# Patient Record
Sex: Female | Born: 1995 | Race: White | Hispanic: No | Marital: Single | Smoking: Never smoker
Health system: Southern US, Community
[De-identification: ages and names within clinical notes are randomized; demographics above are authoritative.]

## PROBLEM LIST (undated history)

## (undated) DIAGNOSIS — J45909 Unspecified asthma, uncomplicated: Secondary | ICD-10-CM

## (undated) DIAGNOSIS — F419 Anxiety disorder, unspecified: Secondary | ICD-10-CM

## (undated) DIAGNOSIS — J189 Pneumonia, unspecified organism: Secondary | ICD-10-CM

## (undated) DIAGNOSIS — F32A Depression, unspecified: Secondary | ICD-10-CM

## (undated) DIAGNOSIS — F909 Attention-deficit hyperactivity disorder, unspecified type: Secondary | ICD-10-CM

## (undated) DIAGNOSIS — F3281 Premenstrual dysphoric disorder: Secondary | ICD-10-CM

## (undated) DIAGNOSIS — F429 Obsessive-compulsive disorder, unspecified: Secondary | ICD-10-CM

## (undated) DIAGNOSIS — R519 Headache, unspecified: Secondary | ICD-10-CM

## (undated) HISTORY — PX: TONSILLECTOMY: SUR1361

---
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
## 2022-08-05 ENCOUNTER — Ambulatory Visit (HOSPITAL_COMMUNITY)
Admission: EM | Admit: 2022-08-05 | Discharge: 2022-08-05 | Disposition: A | Discharge status: Home / Self Care | Payer: BC Managed Care – PPO | Attending: Emergency Medicine | Admitting: Emergency Medicine

## 2022-08-05 ENCOUNTER — Encounter (HOSPITAL_COMMUNITY): Payer: Self-pay | Admitting: *Deleted

## 2022-08-05 DIAGNOSIS — L0291 Cutaneous abscess, unspecified: Secondary | ICD-10-CM | POA: Insufficient documentation

## 2022-08-05 DIAGNOSIS — M545 Low back pain, unspecified: Secondary | ICD-10-CM | POA: Diagnosis present

## 2022-08-05 HISTORY — DX: Premenstrual dysphoric disorder: F32.81

## 2022-08-05 HISTORY — DX: Depression, unspecified: F32.A

## 2022-08-05 HISTORY — DX: Obsessive-compulsive disorder, unspecified: F42.9

## 2022-08-05 HISTORY — DX: Unspecified asthma, uncomplicated: J45.909

## 2022-08-05 HISTORY — DX: Anxiety disorder, unspecified: F41.9

## 2022-08-05 LAB — POCT URINALYSIS DIPSTICK, ED / UC
Bilirubin Urine: NEGATIVE
Glucose, UA: NEGATIVE mg/dL
Hgb urine dipstick: NEGATIVE
Ketones, ur: NEGATIVE mg/dL
Leukocytes,Ua: NEGATIVE
Nitrite: NEGATIVE
Protein, ur: NEGATIVE mg/dL
Specific Gravity, Urine: 1.015 (ref 1.005–1.030)
Urobilinogen, UA: 0.2 mg/dL (ref 0.0–1.0)
pH: 6.5 (ref 5.0–8.0)

## 2022-08-05 LAB — POC URINE PREG, ED: Preg Test, Ur: NEGATIVE

## 2022-08-05 MED ORDER — FLUCONAZOLE 150 MG PO TABS: 150.0000 mg | ORAL_TABLET | Freq: Once | ORAL | 0 refills | Status: DC | PRN

## 2022-08-05 MED ORDER — DOXYCYCLINE HYCLATE 100 MG PO CAPS: 100.0000 mg | ORAL_CAPSULE | Freq: Two times a day (BID) | ORAL | 0 refills | Status: AC

## 2022-08-05 NOTE — ED Provider Notes (Signed)
[facility]    CSN: [id number] Arrival date & time: 08/05/22  1457      History   Chief Complaint Chief Complaint  Patient presents with   Back Pain    HPI Wanda Brewer is a 26 y.o. female.  Presents with right lower back pain.  Additionally, discharge from bellybutton. [facility] of [facility]. Was given 5 days of Macrobid which she completed. Reports dysuria and "kidney pain" continued. Reports back pain worse with movement. Much improved with the ibuprofen dose she took last night. She was worried about kidney infection.  Yesterday noticed discharge from the bellybutton with discomfort. No abdominal pain, vomiting/diarrhea. No fevers.  Reports increase in white clumpy discharge. Denies exposures to STDs.  LMP 7/28. Patient reports some right lower quadrant abdominal discomfort, sometimes with intercourse. She was worried about appendicitis.  Denies any pelvic pain.  On chart review, [facility] was negative and culture was negative.   Past Medical History:  Diagnosis Date   Anxiety    Asthma    Depression    OCD (obsessive compulsive disorder)    PMDD (premenstrual dysphoric disorder)     There are no problems to display for this patient.   Past Surgical History:  Procedure Laterality Date   TONSILLECTOMY      OB History   No obstetric history on file.      Home Medications    Prior to Admission medications   Medication Sig Start Date End Date Taking? Authorizing Provider  albuterol (VENTOLIN HFA) 108 (90 Base) MCG/ACT inhaler Inhale into the lungs. 12/26/20  Yes [provider]  desvenlafaxine (PRISTIQ) 50 MG 24 hr tablet Take 50 mg by mouth daily. 07/26/22  Yes [provider]  doxycycline (VIBRAMYCIN) 100 MG capsule Take 1 capsule (100 mg total) by mouth 2 (two) times daily for 5 days. 08/05/22 08/10/22 Yes Berna Gitto, Lurena Joiner, PA-C  fluconazole (DIFLUCAN) 150 MG tablet Take 1 tablet  (150 mg total) by mouth once as needed for up to 2 doses (take one pill on day 1, and the second pill 3 days later if symptoms do not improve). 08/05/22  Yes Nataly Pacifico, PA-C  Lactic Ac-Citric Ac-Pot Bitart (PHEXXI) 1.8-1-0.4 % GEL Place vaginally.   Yes [provider]    Family History No family history on file.  Social History Social History   Tobacco Use   Smoking status: Never   Smokeless tobacco: Never  Vaping Use   Vaping Use: Some days  Substance Use Topics   Alcohol use: Yes   Drug use: Yes    Types: Marijuana     Allergies   Sulfa antibiotics   Review of Systems Review of Systems  Musculoskeletal:  Positive for back pain.   Per HPI  Physical Exam Triage Vital Signs ED Triage Vitals  Enc Vitals Group     BP 08/05/22 1542 138/81     Pulse Rate 08/05/22 1542 74     Resp 08/05/22 1542 18     Temp 08/05/22 1542 99.3 F (37.4 C)     Temp Source 08/05/22 1542 Oral     SpO2 08/05/22 1542 96 %     Weight --      Height --      Head Circumference --      Peak Flow --      Pain Score 08/05/22 1535 3     Pain Loc --      Pain  Edu? --      Excl. in GC? --    No data found.  Updated Vital Signs BP 138/81 (BP Location: Left Arm)   Pulse 74   Temp 99.3 F (37.4 C) (Oral)   Resp 18   LMP 07/26/2022 (Approximate)   SpO2 96%   Physical Exam Vitals and nursing note reviewed.  Constitutional:      General: She is not in acute distress. HENT:     Nose: Nose normal.     Mouth/Throat:     Mouth: Mucous membranes are moist.     Pharynx: Oropharynx is clear.  Eyes:     Extraocular Movements: Extraocular movements intact.     Conjunctiva/sclera: Conjunctivae normal.     Pupils: Pupils are equal, round, and reactive to light.  Cardiovascular:     Rate and Rhythm: Normal rate and regular rhythm.     Pulses: Normal pulses.     Heart sounds: Normal heart sounds.  Pulmonary:     Effort: Pulmonary effort is normal.     Breath sounds: Normal  breath sounds.  Abdominal:     Palpations: Abdomen is soft. There is no mass.     Tenderness: There is no abdominal tenderness. There is no right CVA tenderness, left CVA tenderness, guarding or rebound.     Comments: Q-tip inserted into bellybutton, produced some discomfort with downward pressure. Some discharged noted on q-tip. No abdominal tenderness to palpation  Musculoskeletal:        General: No swelling or tenderness. Normal range of motion.     Cervical back: Normal range of motion.  Neurological:     Mental Status: She is alert and oriented to person, place, and time.     Cranial Nerves: Cranial nerves 2-12 are intact.     Sensory: Sensation is intact.     Motor: Motor function is intact. No pronator drift.     Coordination: Coordination is intact. Romberg sign negative.     Gait: Gait is intact.     Deep Tendon Reflexes: Reflexes are normal and symmetric.      UC Treatments / Results  Labs (all labs ordered are listed, but only abnormal results are displayed) Labs Reviewed  POCT URINALYSIS DIPSTICK, ED / UC  POC URINE PREG, ED  CERVICOVAGINAL ANCILLARY ONLY    EKG  Radiology No results found.  Procedures Procedures   Medications Ordered in UC Medications - No data to display  Initial Impression / Assessment and Plan / UC Course  I have reviewed the triage vital signs and the nursing notes.  Pertinent labs & imaging results that were available during my care of the patient were reviewed by me and considered in my medical decision making (see chart for details).  The back pain is likely musculoskeletal.  It is located in the low right back.  I did discuss with patient where the kidneys are located and she did not have any CVA tenderness.  Since it improved with ibuprofen, I recommend continuing this with hot pad, ice, massage, stretching.  Urine was again negative.  Urine pregnancy negative. Cytology swab is pending at this time.  With patient discharge, will  treat with fluconazole.  I also provided information for the [facility].  Recommend she follow-up with them regarding the right lower quadrant discomfort with intercourse.  She was nontender on exam and we discussed signs of appendicitis, reassured patient that is not likely what is going on at this time. For the bellybutton  discharge, there was some discharge noted on Q-tip with tenderness with downward pressure.  Likely some sort of infection/abscess in the navel.  Treat with doxycycline twice daily for 5 days.  She will follow-up with her primary care as needed.  Patient agrees to plan.   E/M level 4 due to detailed history and physical, 30-40 minutes spent face to face with patient  Final Clinical Impressions(s) / UC Diagnoses   Final diagnoses:  Abscess  Acute right-sided low back pain without sciatica     Discharge Instructions      We will call you if any result returns positive.   Please take medication as prescribed.  I recommend trying ibuprofen for the low back pain along with hot pad or ice. Try gentle stretching and light massage.  [facility] with the [facility] as needed.     ED Prescriptions     Medication Sig Dispense Auth. Provider   fluconazole (DIFLUCAN) 150 MG tablet Take 1 tablet (150 mg total) by mouth once as needed for up to 2 doses (take one pill on day 1, and the second pill 3 days later if symptoms do not improve). 2 tablet Doran Nestle, PA-C   doxycycline (VIBRAMYCIN) 100 MG capsule Take 1 capsule (100 mg total) by mouth 2 (two) times daily for 5 days. 10 capsule Karyss Frese, Lurena Joiner, PA-C      PDMP not reviewed this encounter.   Marlow Baars, [place] 08/05/22 1657

## 2022-08-05 NOTE — ED Triage Notes (Signed)
[facility] and finished antibiotic they gave. Still having lower back pain. Discharge from belly button has started since finishing antibiotic. Lower back pain worse when she urinates.

## 2022-08-05 NOTE — Discharge Instructions (Addendum)
We will call you if any result returns positive.   Please take medication as prescribed.  I recommend trying ibuprofen for the low back pain along with hot pad or ice. Try gentle stretching and light massage.  Please follow up with the [facility] center as needed.

## 2022-08-06 LAB — CERVICOVAGINAL ANCILLARY ONLY
Bacterial Vaginitis (gardnerella): NEGATIVE
Candida Glabrata: NEGATIVE
Candida Vaginitis: NEGATIVE
Chlamydia: NEGATIVE
Comment: NEGATIVE
Comment: NEGATIVE
Comment: NEGATIVE
Comment: NEGATIVE
Comment: NEGATIVE
Comment: NORMAL
Neisseria Gonorrhea: NEGATIVE
Trichomonas: NEGATIVE

## 2022-12-18 ENCOUNTER — Ambulatory Visit (INDEPENDENT_AMBULATORY_CARE_PROVIDER_SITE_OTHER): Payer: BC Managed Care – PPO

## 2022-12-18 DIAGNOSIS — Z3201 Encounter for pregnancy test, result positive: Secondary | ICD-10-CM | POA: Diagnosis not present

## 2022-12-18 DIAGNOSIS — Z3200 Encounter for pregnancy test, result unknown: Secondary | ICD-10-CM

## 2022-12-18 LAB — POCT PREGNANCY, URINE: Preg Test, Ur: POSITIVE — AB

## 2022-12-18 NOTE — Progress Notes (Signed)
Possible Pregnancy  Here today for pregnancy confirmation. UPT in office today is positive. Pt reports first positive home UPT on 12/11/2022. Reviewed dating with patient:   LMP: 11/08/2022 EDD: 08/15/2023 5w 5d today  OB history reviewed. Reviewed medications and allergies with patient; list of medications safe to take during pregnancy given.  Patient scheduled for initial prenatal 01/14/2023. Pt aware.   Christella Scheuermann, RN 12/18/2022  2:43 PM

## 2022-12-21 ENCOUNTER — Encounter: Payer: Self-pay | Admitting: Advanced Practice Midwife

## 2022-12-26 MED ORDER — PROMETHAZINE HCL 25 MG PO TABS: 25.0000 mg | ORAL_TABLET | Freq: Four times a day (QID) | ORAL | 1 refills | Status: DC | PRN

## 2022-12-31 ENCOUNTER — Telehealth: Payer: Self-pay | Admitting: Family Medicine

## 2022-12-31 NOTE — Telephone Encounter (Signed)
Called patient back regarding her concerns of ongoing nausea. Patient was written prescription for Phenergan. Pt states she has not been taking it as she read on [employer] it can slow brain development. RN advised that medication was safe to take and advised that she begin to take medication as prescribed. Pt verbalized understanding and denied further questions.

## 2022-12-31 NOTE — Telephone Encounter (Signed)
Can not keep food down, even with prescription. Would like a call back

## 2023-01-14 ENCOUNTER — Other Ambulatory Visit: Payer: Self-pay

## 2023-01-14 ENCOUNTER — Encounter: Payer: Self-pay | Admitting: Advanced Practice Midwife

## 2023-01-14 ENCOUNTER — Ambulatory Visit (INDEPENDENT_AMBULATORY_CARE_PROVIDER_SITE_OTHER): Payer: BC Managed Care – PPO | Admitting: Advanced Practice Midwife

## 2023-01-14 ENCOUNTER — Other Ambulatory Visit: Payer: BC Managed Care – PPO

## 2023-01-14 DIAGNOSIS — O021 Missed abortion: Secondary | ICD-10-CM

## 2023-01-14 DIAGNOSIS — O3680X0 Pregnancy with inconclusive fetal viability, not applicable or unspecified: Secondary | ICD-10-CM

## 2023-01-14 DIAGNOSIS — Z3A09 9 weeks gestation of pregnancy: Secondary | ICD-10-CM

## 2023-01-14 MED ORDER — MISOPROSTOL 200 MCG PO TABS: 800.0000 ug | ORAL_TABLET | ORAL | 0 refills | Status: DC

## 2023-01-14 MED ORDER — PROMETHAZINE HCL 25 MG PO TABS: 12.0000 mg | ORAL_TABLET | Freq: Four times a day (QID) | ORAL | 1 refills | Status: DC | PRN

## 2023-01-14 MED ORDER — OXYCODONE HCL 5 MG PO CAPS: 5.0000 mg | ORAL_CAPSULE | Freq: Four times a day (QID) | ORAL | 0 refills | Status: DC | PRN

## 2023-01-14 MED ORDER — IBUPROFEN 800 MG PO TABS: 800.0000 mg | ORAL_TABLET | Freq: Three times a day (TID) | ORAL | 0 refills | Status: DC | PRN

## 2023-01-14 NOTE — Progress Notes (Addendum)
  Subjective:     Patient ID: Wanda Brewer, female   DOB: [birth year], 27 y.o.   MRN: [id number]  Wanda Brewer is a 27 y.o. G1P0000 at [redacted]w[redacted]d who presents today initially for a new OB visit. However, on arrival no FHTs auscultated. She denies any abdominal pain or vaginal bleeding.      Review of Systems  All other systems reviewed and are negative.      Objective:   Physical Exam Vitals and nursing note reviewed. Exam conducted with a chaperone present.  Constitutional:      Appearance: She is well-developed.  HENT:     Head: Normocephalic.  Cardiovascular:     Rate and Rhythm: Normal rate.  Pulmonary:     Effort: Pulmonary effort is normal. No respiratory distress.  Abdominal:     Palpations: Abdomen is soft.     Tenderness: There is no abdominal tenderness.  Genitourinary:    Vagina: No bleeding. Vaginal discharge: mucusy. Musculoskeletal:        General: Normal range of motion.     Cervical back: Normal range of motion and neck supple.  Skin:    General: Skin is warm and dry.  Neurological:     Mental Status: She is alert and oriented to person, place, and time.    Trans abdominal US attempted, but unable to fully visualize the pregnancy  Dr. Dione Plover performed TVUS and confirmed that patient has a non viable pregnancy: irregularly shaped IUGS, enlarged yolk sac and fetal pole with no cardiac activity      Assessment:     1. Pregnancy with inconclusive fetal viability, single or unspecified fetus   2. Missed abortion   3. [redacted] weeks gestation of pregnancy        Plan:     Patient given options including expectant management or cytotec After counseling and reviewing R/B/A patient elects to proceed with cytotec Detailed instructions given on how to take  RX:  Cytotec 839mcg q 24 hours x 2 doses Ibuprofen 800mg  PRN  Phenergan PRN #[id number]  Oxycodone 5mg  PRN  All sent to patient's pharmacy  Need to have her reutrn for lab visit tomorrow for CBC and ABO RH 2  week SAB follow up   [place], CNM  01/14/23  5:42 PM

## 2023-01-15 ENCOUNTER — Other Ambulatory Visit: Payer: Self-pay

## 2023-01-15 ENCOUNTER — Other Ambulatory Visit: Payer: BC Managed Care – PPO

## 2023-01-15 DIAGNOSIS — O021 Missed abortion: Secondary | ICD-10-CM

## 2023-01-16 LAB — CBC
Hematocrit: 39.9 % (ref 34.0–46.6)
Hemoglobin: 13 g/dL (ref 11.1–15.9)
MCH: 29.7 pg (ref 26.6–33.0)
MCHC: 32.6 g/dL (ref 31.5–35.7)
MCV: 91 fL (ref 79–97)
Platelets: 263 10*3/uL (ref 150–450)
RBC: 4.37 x10E6/uL (ref 3.77–5.28)
RDW: 11.9 % (ref 11.7–15.4)
WBC: 7.2 10*3/uL (ref 3.4–10.8)

## 2023-01-16 LAB — ABO AND RH: Rh Factor: POSITIVE

## 2023-01-17 ENCOUNTER — Encounter: Payer: Self-pay | Admitting: Advanced Practice Midwife

## 2023-01-20 ENCOUNTER — Telehealth: Payer: Self-pay | Admitting: Family Medicine

## 2023-01-20 DIAGNOSIS — O034 Incomplete spontaneous abortion without complication: Secondary | ICD-10-CM

## 2023-01-20 NOTE — Telephone Encounter (Signed)
Patient called in stating she did not experience bleeding after taking her cytotec. After hours nurse line told her she needed to be seen within 24 hours but she would like our nurses to follow up with here instead.

## 2023-01-20 NOTE — Telephone Encounter (Signed)
Called patient and discussed follow up ultrasound to rule out retained products. Scheduled appt for tomorrow at 11am in office. Patient verbalized understanding.

## 2023-01-21 ENCOUNTER — Encounter: Payer: Self-pay | Admitting: Advanced Practice Midwife

## 2023-01-21 ENCOUNTER — Telehealth: Payer: Self-pay

## 2023-01-21 ENCOUNTER — Ambulatory Visit (INDEPENDENT_AMBULATORY_CARE_PROVIDER_SITE_OTHER): Payer: BC Managed Care – PPO

## 2023-01-21 ENCOUNTER — Ambulatory Visit (INDEPENDENT_AMBULATORY_CARE_PROVIDER_SITE_OTHER): Payer: BC Managed Care – PPO | Admitting: Advanced Practice Midwife

## 2023-01-21 ENCOUNTER — Other Ambulatory Visit: Payer: Self-pay

## 2023-01-21 DIAGNOSIS — O034 Incomplete spontaneous abortion without complication: Secondary | ICD-10-CM

## 2023-01-21 DIAGNOSIS — Z3A10 10 weeks gestation of pregnancy: Secondary | ICD-10-CM

## 2023-01-21 DIAGNOSIS — O219 Vomiting of pregnancy, unspecified: Secondary | ICD-10-CM | POA: Diagnosis not present

## 2023-01-21 DIAGNOSIS — Z3A01 Less than 8 weeks gestation of pregnancy: Secondary | ICD-10-CM | POA: Diagnosis not present

## 2023-01-21 MED ORDER — ONDANSETRON 4 MG PO TBDP: 4.0000 mg | ORAL_TABLET | Freq: Four times a day (QID) | ORAL | 1 refills | Status: DC | PRN

## 2023-01-21 NOTE — Progress Notes (Addendum)
Ultrasounds Results Note  SUBJECTIVE HPI:  Ms. Wanda Brewer is a 27 y.o. G1P0000 at [redacted]w[redacted]d by LMP who presents to [facility] for followup ultrasound results. Dx MAB 01/14/23. Rx Cytotec 800 mg. Took two doses 24 hours apart 1/17 and 1/18. Only had light bleeding. No passage of tissue. Or clots. Denies abd pain.     Previous US 01/14/23: Dr. Dione Plover performed TVUS and confirmed that patient has a non viable pregnancy: irregularly shaped IUGS, enlarged yolk sac and 6.3 weeks fetal pole (0.6 cm) with no cardiac activity.     A/Positive/-- (01/17 1422) Hgb 13.0  Repeat ultrasound was performed earlier today.   Past Medical History:  Diagnosis Date   Anxiety    Asthma    Depression    OCD (obsessive compulsive disorder)    PMDD (premenstrual dysphoric disorder)    Past Surgical History:  Procedure Laterality Date   TONSILLECTOMY     Social History   Socioeconomic History   Marital status: Single    Spouse name: Not on file   Number of children: Not on file   Years of education: Not on file   Highest education level: Not on file  Occupational History   Not on file  Tobacco Use   Smoking status: Never   Smokeless tobacco: Never  Vaping Use   Vaping Use: Former  Substance and Sexual Activity   Alcohol use: Not Currently   Drug use: Not Currently   Sexual activity: Yes    Comment: phexxi vaginal gel  Other Topics Concern   Not on file  Social History Narrative   Not on file   Social Determinants of Health   Financial Resource Strain: Not on file  Food Insecurity: Not on file  Transportation Needs: Not on file  Physical Activity: Not on file  Stress: Not on file  Social Connections: Not on file  Intimate Partner Violence: Not on file   Current Outpatient Medications on File Prior to Visit  Medication Sig Dispense Refill   albuterol (VENTOLIN HFA) 108 (90 Base) MCG/ACT inhaler Inhale into the lungs.     desvenlafaxine (PRISTIQ) 50 MG 24 hr tablet  Take 50 mg by mouth daily. (Patient not taking: Reported on 12/18/2022)     fluconazole (DIFLUCAN) 150 MG tablet Take 1 tablet (150 mg total) by mouth once as needed for up to 2 doses (take one pill on day 1, and the second pill 3 days later if symptoms do not improve). (Patient not taking: Reported on 12/18/2022) 2 tablet 0   ibuprofen (ADVIL) 800 MG tablet Take 1 tablet (800 mg total) by mouth every 8 (eight) hours as needed. 30 tablet 0   Lactic Ac-Citric Ac-Pot Bitart (PHEXXI) 1.8-1-0.4 % GEL Place vaginally. (Patient not taking: Reported on 12/18/2022)     misoprostol (CYTOTEC) 200 MCG tablet Take 4 tablets (800 mcg total) by mouth daily for 2 doses. 8 tablet 0   oxycodone (OXY-IR) 5 MG capsule Take 1-2 capsules (5-10 mg total) by mouth every 6 (six) hours as needed. 15 capsule 0   Prenatal Vit-Fe Fumarate-FA (MULTIVITAMIN-PRENATAL) 27-0.8 MG TABS tablet Take 1 tablet by mouth daily at 12 noon.     promethazine (PHENERGAN) 25 MG tablet Take 0.5-1 tablets (12.5-25 mg total) by mouth every 6 (six) hours as needed for nausea or vomiting. 30 tablet 1   No current facility-administered medications on file prior to visit.   Allergies  Allergen Reactions   Shellfish Allergy Itching and Swelling  Sulfa Antibiotics Rash    I have reviewed patient's Past Medical Hx, Surgical Hx, Family Hx, Social Hx, medications and allergies.   Review of Systems Review of Systems  Constitutional: Negative for fever and chills.  Gastrointestinal: Negative for abdominal pain. Pos for N/V. Phenergan helps but causes too much drowsiness.  Genitourinary: Negative for vaginal bleeding.  Musculoskeletal: Negative for back pain.  Neurological: Negative for dizziness and weakness.    Physical Exam  LMP 11/08/2022   Patient's last menstrual period was 11/08/2022. GENERAL: Well-developed, well-nourished female in no acute distress.  HEENT: Normocephalic, atraumatic.   LUNGS: Effort normal ABDOMEN:  Deferred HEART: Regular rate  SKIN: Warm, dry and without erythema PSYCH: Normal mood and affect NEURO: Alert and oriented x 4  LAB RESULTS No results found for this or any previous visit (from the past 24 hour(s)).  IMAGING 5.6 week (0.25 cm) fetal pole w/out cardiac activity. No change from [place] 01/14/23.   ASSESSMENT 1. Incomplete miscarriage   2. Nausea and vomiting during pregnancy     PLAN Discussed options for management of incomplete AB including expectant management, additional Cytotec doses or D&C. Prefers D&C at this time. R/B/I/A discussed and questions addressed. Message sent to surgery scheduler to schedule D&C with next available physician. Support given. Rx Zofran for N/V.  Go to MAU as needed for heavy bleeding, abdominal pain or fever greater than 100.4.  [employer], [place] 01/21/2023 12:02 PM

## 2023-01-21 NOTE — Telephone Encounter (Signed)
Called patient, surgery date, time, location and preop instructions given. Patient expressed understanding. 

## 2023-01-22 ENCOUNTER — Encounter (HOSPITAL_COMMUNITY): Payer: Self-pay | Admitting: Obstetrics & Gynecology

## 2023-01-22 ENCOUNTER — Other Ambulatory Visit: Payer: Self-pay | Admitting: Advanced Practice Midwife

## 2023-01-22 DIAGNOSIS — O034 Incomplete spontaneous abortion without complication: Secondary | ICD-10-CM

## 2023-01-22 MED ORDER — MISOPROSTOL 200 MCG PO TABS: 800.0000 ug | ORAL_TABLET | ORAL | 0 refills | Status: DC

## 2023-01-22 MED ORDER — DOXYCYCLINE HYCLATE 100 MG IV SOLR
200.0000 mg | Freq: Once | INTRAVENOUS | Status: DC
  Filled 2023-01-22 (×2): qty 200

## 2023-01-22 NOTE — Progress Notes (Signed)
PCP - None Cardiologist - n/a  Chest x-ray - n/a EKG - n/a Stress Test - n/a ECHO - n/a Cardiac Cath - n/a  ICD Pacemaker/Loop - n/a  Sleep Study -  n/a CPAP - none  Diet - NPO  STOP now taking any Aspirin (unless otherwise instructed by your surgeon), Aleve, Naproxen, Ibuprofen, Motrin, Advil, Goody's, BC's, all herbal medications, fish oil, and all vitamins.   Coronavirus Screening Do you have any of the following symptoms:  Cough yes/no: No Fever (>100.46F)  yes/no: No Runny nose yes/no: No Sore throat yes/no: No Difficulty breathing/shortness of breath  yes/no: No  Have you traveled in the last 14 days and where? yes/no: No  Patient verbalized understanding of instructions that were given via phone.

## 2023-01-23 ENCOUNTER — Encounter (HOSPITAL_COMMUNITY): Payer: Self-pay | Admitting: Anesthesiology

## 2023-01-23 ENCOUNTER — Other Ambulatory Visit: Payer: Self-pay | Admitting: Obstetrics and Gynecology

## 2023-01-23 ENCOUNTER — Telehealth: Payer: Self-pay

## 2023-01-23 ENCOUNTER — Ambulatory Visit (HOSPITAL_COMMUNITY)
Admission: RE | Admit: 2023-01-23 | Payer: BC Managed Care – PPO | Source: Home / Self Care | Admitting: Obstetrics & Gynecology

## 2023-01-23 DIAGNOSIS — O021 Missed abortion: Secondary | ICD-10-CM

## 2023-01-23 HISTORY — DX: Pneumonia, unspecified organism: J18.9

## 2023-01-23 HISTORY — DX: Headache, unspecified: R51.9

## 2023-01-23 HISTORY — DX: Attention-deficit hyperactivity disorder, unspecified type: F90.9

## 2023-01-23 SURGERY — DILATION AND EVACUATION, UTERUS
Anesthesia: Choice

## 2023-01-23 NOTE — Telephone Encounter (Signed)
Called patient, no answer, left voicemail with rescheduled surgery date, time, location and preop instructions.

## 2023-01-24 NOTE — Progress Notes (Signed)
I called Ms Siller several times and left a message asking patient to return call, patient did not  return the calls.  I called and left these instructions, arrive at 1130 on Monday, do not eat or drink after midnight.  If needed Monday am may use Albuterol, Zofran, Oxy, Phenergan.  I gave Ms Connon hygiene and instructions for belongings.

## 2023-01-25 ENCOUNTER — Encounter (HOSPITAL_COMMUNITY): Payer: Self-pay | Admitting: Anesthesiology

## 2023-01-27 ENCOUNTER — Ambulatory Visit (HOSPITAL_COMMUNITY)
Admission: RE | Admit: 2023-01-27 | Payer: BC Managed Care – PPO | Source: Home / Self Care | Admitting: Obstetrics and Gynecology

## 2023-01-27 ENCOUNTER — Encounter: Payer: Self-pay | Admitting: Advanced Practice Midwife

## 2023-01-27 ENCOUNTER — Telehealth: Payer: Self-pay | Admitting: Advanced Practice Midwife

## 2023-01-27 DIAGNOSIS — O034 Incomplete spontaneous abortion without complication: Secondary | ICD-10-CM

## 2023-01-27 SURGERY — DILATION AND EVACUATION, UTERUS
Anesthesia: Choice

## 2023-01-27 NOTE — Telephone Encounter (Signed)
Called pt back about miscarriage and whether D&C is still indicated. Reports mod-heavy bleeding, cramping and passing what looked like tissue for a few days after taking Cytotec dose 3 on 1/24 and dose 4 on 1/25. Bleeding has lightened to less than that of a period over the past few days. Cramping resolved. Denies fever and chills. C/W completed miscarriage. Offered F/U US at the suggestion of Dr. Elly Modena to verify but pt declines for now. CNM recommends [place] if bleeding is moderate at the end of the week (01/31/23). Instructed to go to MAU for fever, heavy bleeding, severe pain. Pt verbalized understanding. She expressed appreciation for her care.   Pt also reports that she cancelled the D&C initially due to cost.

## 2023-01-27 NOTE — Telephone Encounter (Signed)
Left VM checking to see if pt still needs D&C or likely completed miscarriage with additional doses of Cytotec. Is scheduled for D&C today but did not answer pre-op phone call. Will send MyChart message.

## 2023-01-29 ENCOUNTER — Ambulatory Visit: Payer: BC Managed Care – PPO | Admitting: Certified Nurse Midwife

## 2023-02-03 NOTE — Addendum Note (Signed)
Addended by: Shelly Coss on: 02/03/2023 11:09 AM   Modules accepted: Orders

## 2023-02-05 ENCOUNTER — Ambulatory Visit (INDEPENDENT_AMBULATORY_CARE_PROVIDER_SITE_OTHER): Payer: BC Managed Care – PPO | Admitting: Obstetrics & Gynecology

## 2023-02-05 ENCOUNTER — Ambulatory Visit
Admission: RE | Admit: 2023-02-05 | Discharge: 2023-02-05 | Disposition: A | Discharge status: Home / Self Care | Payer: BC Managed Care – PPO | Source: Ambulatory Visit | Attending: Advanced Practice Midwife | Admitting: Advanced Practice Midwife

## 2023-02-05 VITALS — BP 119/88 | HR 99 | Ht 68.0 in | Wt 228.0 lb

## 2023-02-05 DIAGNOSIS — O034 Incomplete spontaneous abortion without complication: Secondary | ICD-10-CM

## 2023-02-05 DIAGNOSIS — Z3A12 12 weeks gestation of pregnancy: Secondary | ICD-10-CM

## 2023-02-05 NOTE — Progress Notes (Signed)
Ultrasounds Results Note  SUBJECTIVE HPI:  Wanda Brewer is a 27 y.o. G1P0000 at [redacted]w[redacted]d by LMP who presents to the [facility] for followup ultrasound results.  Past Medical History:  Diagnosis Date   ADHD (attention deficit hyperactivity disorder)    Anxiety    Asthma    Depression    Headache    otc med prn   OCD (obsessive compulsive disorder)    PMDD (premenstrual dysphoric disorder)    Pneumonia    x 2   Past Surgical History:  Procedure Laterality Date   TONSILLECTOMY     Social History   Socioeconomic History   Marital status: Single    Spouse name: Not on file   Number of children: Not on file   Years of education: Not on file   Highest education level: Not on file  Occupational History   Not on file  Tobacco Use   Smoking status: Never   Smokeless tobacco: Former   Tobacco comments:    Delta 8  Vaping Use   Vaping Use: Former   Substances: THC, CBD  Substance and Sexual Activity   Alcohol use: Yes   Drug use: Not Currently   Sexual activity: Yes    Birth control/protection: None    Comment: approx [redacted] wks gestation  Other Topics Concern   Not on file  Social History Narrative   Not on file   Social Determinants of Health   Financial Resource Strain: Not on file  Food Insecurity: Not on file  Transportation Needs: Not on file  Physical Activity: Not on file  Stress: Not on file  Social Connections: Not on file  Intimate Partner Violence: Not on file   Current Outpatient Medications on File Prior to Visit  Medication Sig Dispense Refill   albuterol (VENTOLIN HFA) 108 (90 Base) MCG/ACT inhaler Inhale into the lungs. (Patient not taking: Reported on 02/05/2023)     desvenlafaxine (PRISTIQ) 50 MG 24 hr tablet Take 50 mg by mouth daily. (Patient not taking: Reported on 12/18/2022)     fluconazole (DIFLUCAN) 150 MG tablet Take 1 tablet (150 mg total) by mouth once as needed for up to 2 doses (take one pill on day 1, and the second  pill 3 days later if symptoms do not improve). (Patient not taking: Reported on 12/18/2022) 2 tablet 0   ibuprofen (ADVIL) 800 MG tablet Take 1 tablet (800 mg total) by mouth every 8 (eight) hours as needed. (Patient not taking: Reported on 02/05/2023) 30 tablet 0   Lactic Ac-Citric Ac-Pot Bitart (PHEXXI) 1.8-1-0.4 % GEL Place vaginally. (Patient not taking: Reported on 12/18/2022)     misoprostol (CYTOTEC) 200 MCG tablet Place 4 tablets (800 mcg total) vaginally daily for 2 doses. 8 tablet 0   ondansetron (ZOFRAN-ODT) 4 MG disintegrating tablet Take 1-2 tablets (4-8 mg total) by mouth every 6 (six) hours as needed for nausea. (Patient not taking: Reported on 02/05/2023) 30 tablet 1   oxycodone (OXY-IR) 5 MG capsule Take 1-2 capsules (5-10 mg total) by mouth every 6 (six) hours as needed. (Patient not taking: Reported on 02/05/2023) 15 capsule 0   Prenatal Vit-Fe Fumarate-FA (MULTIVITAMIN-PRENATAL) 27-0.8 MG TABS tablet Take 1 tablet by mouth daily at 12 noon. (Patient not taking: Reported on 02/05/2023)     promethazine (PHENERGAN) 25 MG tablet Take 0.5-1 tablets (12.5-25 mg total) by mouth every 6 (six) hours as needed for nausea or vomiting. (Patient not taking: Reported on 02/05/2023) 30 tablet 1  Current Facility-Administered Medications on File Prior to Visit  Medication Dose Route Frequency Provider Last Rate Last Admin   doxycycline (VIBRAMYCIN) 200 mg in dextrose 5 % 250 mL IVPB  200 mg Intravenous Once Janyth Pupa, DO       Allergies  Allergen Reactions   Shellfish Allergy Itching and Swelling   Sulfa Antibiotics Rash    I have reviewed patient's Past Medical Hx, Surgical Hx, Family Hx, Social Hx, medications and allergies.   Review of Systems Review of Systems  Constitutional: Negative for fever and chills.  Gastrointestinal: Negative for nausea, vomiting, abdominal pain, diarrhea and constipation.  Genitourinary: Negative for dysuria.  Musculoskeletal: Negative for back pain.   Neurological: Negative for dizziness and weakness.    Physical Exam  BP 119/88   Pulse 99   Ht 5\' 8"  (1.727 m)   Wt 228 lb (103.4 kg)   LMP 11/08/2022   BMI 34.67 kg/m   GENERAL: Well-developed, well-nourished female in no acute distress.  HEENT: Normocephalic, atraumatic.   LUNGS: Effort normal ABDOMEN: soft, non-tender HEART: Regular rate  SKIN: Warm, dry and without erythema PSYCH: Normal mood and affect NEURO: Alert and oriented x 4  LAB RESULTS No results found for this or any previous visit (from the past 24 hour(s)).  IMAGING US OB Transvaginal  Result Date: 02/05/2023 CLINICAL DATA:  Evaluate for retained products. EXAM: OBSTETRIC <14 WK [place] AND TRANSVAGINAL OB US TECHNIQUE: Both transabdominal and transvaginal ultrasound examinations were performed for complete evaluation of the gestation as well as the maternal uterus, adnexal regions, and pelvic cul-de-sac. Transvaginal technique was performed to assess early pregnancy. COMPARISON:  Ultrasound 01/21/2023 FINDINGS: Intrauterine gestational sac: None Yolk sac:  N/A Embryo:  N/A Cardiac Activity: N/A Heart Rate: N/A bpm Subchorionic hemorrhage:  None Maternal uterus/adnexae: Markedly thickened heterogeneous endometrium measuring up to 3.2 cm. Moderate vascularity. There is a small amount fluid in the endometrial canal. The right ovary is normal. The left ovary could not be visualized. No adnexal mass. No free pelvic fluid. IMPRESSION: Markedly thickened heterogeneous endometrium with moderate vascularity along with a small amount of endometrial fluid. Findings suggest retained products related to recent SAB. Electronically Signed   By: Marijo Sanes M.D.   On: 02/05/2023 15:42   US OB Transvaginal  Result Date: 01/21/2023 CLINICAL DATA:  f/u viability Exam: OBSTETRIC <14 WK [place] and TRANSVAGINAL OB US Technique:  Transvaginal ultrasound examination was performed for complete evaluation of the gestation as well at the maternal  uterus, adnexal regions, and pelvic cul-de-sac.  Transvaginal technique was performed to assess early pregnancy. Comparison:  01/14/2023 Findings: Nelda Marseille IUP noted Yolk sac: visualized Embryo: visualized Cardiac activity: not visualized CRL: 0.25 cm 5wk 6d  [place] EDC:  09/17/2023 Cervix: long and closed Adnexa: WNL Subchorionic hemorrhage:  no Other findings:  no significant change from prior exam Impression: Findings meet criteria for failed pregnancy Recommendations: F/u as clinically indicated   ASSESSMENT Retained products of conception after miscarriage   PLAN Discharge home in stable condition Schedule suction D&E after failed treatment with Cytotec  Woodroe Mode, MD  02/05/2023  4:54 PM

## 2023-02-06 ENCOUNTER — Other Ambulatory Visit: Payer: Self-pay | Admitting: General Practice

## 2023-02-06 ENCOUNTER — Encounter: Payer: Self-pay | Admitting: Advanced Practice Midwife

## 2023-02-06 ENCOUNTER — Telehealth: Payer: Self-pay

## 2023-02-06 DIAGNOSIS — O034 Incomplete spontaneous abortion without complication: Secondary | ICD-10-CM

## 2023-02-06 NOTE — Telephone Encounter (Signed)
Called patient to give her surgery date and time, patient interjected and informed me that she would be transferring to [facility] and declined to have surgery.

## 2023-02-06 NOTE — Progress Notes (Signed)
I spoke with Wanda Brewer, she reported that she just spoke to surgeon's office; she is cancelling surgery, she needs to transfer to [facility] system , due to financial reasons.

## 2023-02-07 ENCOUNTER — Ambulatory Visit (HOSPITAL_COMMUNITY)
Admission: RE | Admit: 2023-02-07 | Payer: BC Managed Care – PPO | Source: Home / Self Care | Admitting: Obstetrics and Gynecology

## 2023-02-07 SURGERY — DILATION AND EVACUATION, UTERUS
Anesthesia: Choice

## 2023-02-13 ENCOUNTER — Encounter: Payer: BC Managed Care – PPO | Admitting: Certified Nurse Midwife

## 2023-02-13 ENCOUNTER — Ambulatory Visit: Payer: BC Managed Care – PPO | Admitting: Certified Nurse Midwife

## 2023-02-28 DIAGNOSIS — Z419 Encounter for procedure for purposes other than remedying health state, unspecified: Secondary | ICD-10-CM | POA: Diagnosis not present

## 2023-03-31 DIAGNOSIS — Z419 Encounter for procedure for purposes other than remedying health state, unspecified: Secondary | ICD-10-CM | POA: Diagnosis not present

## 2023-04-30 DIAGNOSIS — Z419 Encounter for procedure for purposes other than remedying health state, unspecified: Secondary | ICD-10-CM | POA: Diagnosis not present

## 2023-05-31 DIAGNOSIS — Z419 Encounter for procedure for purposes other than remedying health state, unspecified: Secondary | ICD-10-CM | POA: Diagnosis not present

## 2023-06-30 DIAGNOSIS — Z419 Encounter for procedure for purposes other than remedying health state, unspecified: Secondary | ICD-10-CM | POA: Diagnosis not present

## 2023-07-31 DIAGNOSIS — Z419 Encounter for procedure for purposes other than remedying health state, unspecified: Secondary | ICD-10-CM | POA: Diagnosis not present

## 2023-08-31 DIAGNOSIS — Z419 Encounter for procedure for purposes other than remedying health state, unspecified: Secondary | ICD-10-CM | POA: Diagnosis not present

## 2023-09-30 DIAGNOSIS — Z419 Encounter for procedure for purposes other than remedying health state, unspecified: Secondary | ICD-10-CM | POA: Diagnosis not present

## 2023-10-31 DIAGNOSIS — Z419 Encounter for procedure for purposes other than remedying health state, unspecified: Secondary | ICD-10-CM | POA: Diagnosis not present

## 2023-11-30 DIAGNOSIS — Z419 Encounter for procedure for purposes other than remedying health state, unspecified: Secondary | ICD-10-CM | POA: Diagnosis not present

## 2023-12-31 DIAGNOSIS — Z419 Encounter for procedure for purposes other than remedying health state, unspecified: Secondary | ICD-10-CM | POA: Diagnosis not present

## 2024-01-31 DIAGNOSIS — Z419 Encounter for procedure for purposes other than remedying health state, unspecified: Secondary | ICD-10-CM | POA: Diagnosis not present

## 2024-02-28 DIAGNOSIS — Z419 Encounter for procedure for purposes other than remedying health state, unspecified: Secondary | ICD-10-CM | POA: Diagnosis not present

## 2024-04-10 DIAGNOSIS — Z419 Encounter for procedure for purposes other than remedying health state, unspecified: Secondary | ICD-10-CM | POA: Diagnosis not present

## 2024-05-10 DIAGNOSIS — Z419 Encounter for procedure for purposes other than remedying health state, unspecified: Secondary | ICD-10-CM | POA: Diagnosis not present

## 2024-06-10 DIAGNOSIS — Z419 Encounter for procedure for purposes other than remedying health state, unspecified: Secondary | ICD-10-CM | POA: Diagnosis not present

## 2024-07-10 DIAGNOSIS — Z419 Encounter for procedure for purposes other than remedying health state, unspecified: Secondary | ICD-10-CM | POA: Diagnosis not present

## 2024-10-07 ENCOUNTER — Ambulatory Visit (HOSPITAL_COMMUNITY): Payer: Self-pay

## 2025-01-26 NOTE — Progress Notes (Signed)
 Virtual Visit  Subjective: Subjective Patient ID: Wanda Brewer is a 29 y.o. female.  HPI  Patient reports that she was running after her dog and tripped. Patient reports that her left toes were affected. Reports bruising on the area and pain with weight bearing on the left foot. Patient also reports pain when wiggling her toes. Has iced and taken OTC pain relief  MSKPatient presents with:  Pain Chronicity:  New Onset:  4 days History of trauma: Yes   Mechanism of Injury:  Fall Progression since onset:  Unchanged Affected location(s):  Lower body Affected location(s) - Lower Body::  Foot Laterality:  On the left Quality/Characterisitics:  Aching and dull Pain severity:  Moderate Associated symptoms: swelling   Associated symptoms: no chest pain, no chills, no diaphoresis, no fever, not non-weight bearing, no joint instability, no joint locking, no decreased active range of motion, not numbness, no redness, no joint stiffness, not tingling, no joint warmth, no muscle weakness, no Urine Incontinence and no bowel incontinence   Aggravated factor:  Activity Activity:  Walking Allevating factors:  Medication Medication:  OTC Improvement on treatment:  Mild Functional Ability:  Independent  Review of Systems  Constitutional:  Negative for chills, diaphoresis and fever.  Cardiovascular:  Negative for chest pain.   Tobacco Use History[1] Past Medical History:  Diagnosis Date   ADHD (attention deficit hyperactivity disorder)    Allergic disorder    Asthma    Depression    OCD (obsessive compulsive disorder)    Past Surgical History:  Procedure Laterality Date   TONSILLECTOMY     No family history on file.  Objective: Objective Physical Exam Constitutional:      Appearance: Normal appearance.  Musculoskeletal:       Feet:  Feet:     Comments: Bruising and swelling noted  Neurological:     Mental Status: She is alert.       Assessment/Plan: Assessment HPI  provided by Self  Based on today's visit:history and physical exam only, as no relevant testing deemed necessary patient's visit diagnosis is/includes No diagnosis found. Patient does not have a history of chronic conditions.   Plan Treatment plan includes:  Orders Placed: No orders of the defined types were placed in this encounter.  Medications ordered this visit    No prescriptions requested or ordered in this encounter    Current medication list and any new medications prescribed or recommended today were reviewed with the patient and specific instructions were provided Yes  Provider Recommendations         Patient denies being involved in work related injury. Patient denies injury being caused by a motor vehicle accident. Patient denies unintentional weight loss, fever and malaise. Patient denies pain being severe or out of proportion. Patient denies recentl traumatic event that cause injury. Patient does not have neurological deficits during encounter. Denies having a hard time urinating and defecating. Denies changes with urination and deficating. Patient is not showing signs of fracture. Denies history of malignancy. No signs of infectious process noted during encounter. Patient able to bear weight and able to move extremity without issues. No signs of compartment symdrome noted during encounter. Denies history of DVT. Denies recent joint and bone surgical procedures  Other recommendations: Follow up with urgent care for diagnostic test to rule out toe fracture. Need to be in person to consider immobilizing the affected toes. Patient agreed with plan of care and reports that she will be going to the urgent care today to  follow up.   Follow up care instructions were provided and reviewed?with the  Patient. All questions were answered. Patient verbalized understanding of plan of care today.                                               I am having  Wanda Brewer maintain her drospirenone-ethinyl estradiol, montelukast, FLUoxetine, dextroamphetamine-amphetamine, beclomethasone, and desvenlafaxine.  I have verified the patient's location, and I am licensed to practice in that state. Audio and video technology were used to conduct this virtual visit. Patient (or parent/guardian as applicable) consented to virtual care.  Patient is: not a minor           [1] Social History Tobacco Use  Smoking Status Never   Passive exposure: Never  Smokeless Tobacco Never

## 2025-01-27 ENCOUNTER — Ambulatory Visit (HOSPITAL_COMMUNITY)

## 2025-01-27 ENCOUNTER — Ambulatory Visit (HOSPITAL_COMMUNITY)
Admission: RE | Admit: 2025-01-27 | Discharge: 2025-01-27 | Disposition: A | Discharge status: Home / Self Care | Payer: Self-pay | Source: Ambulatory Visit

## 2025-01-27 ENCOUNTER — Encounter (HOSPITAL_COMMUNITY): Payer: Self-pay

## 2025-01-27 VITALS — BP 130/74 | HR 85 | Temp 98.8°F | Resp 18

## 2025-01-27 DIAGNOSIS — S99922A Unspecified injury of left foot, initial encounter: Secondary | ICD-10-CM | POA: Diagnosis not present

## 2025-01-27 DIAGNOSIS — M79672 Pain in left foot: Secondary | ICD-10-CM

## 2025-01-27 MED ORDER — IBUPROFEN 800 MG PO TABS: 800.0000 mg | ORAL_TABLET | Freq: Three times a day (TID) | ORAL | 0 refills | Status: AC | PRN

## 2025-01-27 NOTE — ED Triage Notes (Signed)
 Patient presents for left foot injury x 6 days ago that is not going better.  Patient fell.  Patient has been taking ibphofen and put ice on it.

## 2025-01-27 NOTE — Discharge Instructions (Addendum)
 Your left foot x-ray is negative for fracture or dislocation.  We wrapped your foot in an Ace wrap.  You have been provided with crutches.  You have been given a prescription for ibuprofen .  Take 1 tablet every 8 hours as needed. Take Tylenol as needed.  Rest, ice, elevate, and compress the injury to reduce swelling and inflammation.  If your symptoms do not improve over the next 3-5 days, schedule an appointment with the orthopedic provider listed below: [facility] [address] [place], [place]  [address] [phone]  Return if you experience worsening pain, numbness, tingling, skin color changes, or any other concerning symptoms. If symptoms are severe, please go to the ER. I hope you feel better!!

## 2025-01-27 NOTE — ED Provider Notes (Signed)
 " [facility]    CSN: [id number] Arrival date & time: 01/27/25  1320      History   Chief Complaint Chief Complaint  Patient presents with   Foot Injury    Entered by patient    HPI Wanda Brewer is a 29 y.o. female.   This 29 year old female is being seen for complaints of left foot pain.  She reports 6 days ago her foot got tangled in her vacuum cord and her left foot third, fourth, fifth digits pinned under her foot while her first 2 digits remained in the appropriate position.  Since that time she has had significant pain, swelling, bruising to her left foot.  She denies headache, dizziness, chest pain, shortness of breath.  She reports she will intermittently have numbness and tingling in her toes, there is no rhyme or reason to when it occurs.  She has been taking ibuprofen  and icing her foot without significant relief of symptoms.   Foot Injury Associated symptoms: no fever     Past Medical History:  Diagnosis Date   ADHD (attention deficit hyperactivity disorder)    Anxiety    Asthma    Depression    Headache    otc med prn   OCD (obsessive compulsive disorder)    PMDD (premenstrual dysphoric disorder)    Pneumonia    x 2    Patient Active Problem List   Diagnosis Date Noted   Retained products of conception after miscarriage 02/05/2023    Past Surgical History:  Procedure Laterality Date   TONSILLECTOMY      OB History     Gravida  1   Para  0   Term  0   Preterm  0   AB  0   Living  0      SAB  0   IAB  0   Ectopic  0   Multiple  0   Live Births  0            Home Medications    Prior to Admission medications  Medication Sig Start Date End Date Taking? Authorizing Provider  ibuprofen  (ADVIL ) 800 MG tablet Take 1 tablet (800 mg total) by mouth every 8 (eight) hours as needed. 01/27/25  Yes Kyshaun Barnette C, FNP  albuterol (VENTOLIN HFA) 108 (90 Base) MCG/ACT inhaler Inhale into the lungs. Patient not taking:  Reported on 02/05/2023 12/26/20   [provider]  desvenlafaxine (PRISTIQ) 50 MG 24 hr tablet Take 50 mg by mouth daily. Patient not taking: Reported on 12/18/2022 07/26/22   [provider]  fluconazole  (DIFLUCAN ) 150 MG tablet Take 1 tablet (150 mg total) by mouth once as needed for up to 2 doses (take one pill on day 1, and the second pill 3 days later if symptoms do not improve). Patient not taking: Reported on 12/18/2022 08/05/22   Rising, Asberry, PA-C  Lactic Ac-Citric Ac-Pot Bitart (PHEXXI) 1.8-1-0.4 % GEL Place vaginally. Patient not taking: Reported on 12/18/2022    [provider]  misoprostol  (CYTOTEC ) 200 MCG tablet Place 4 tablets (800 mcg total) vaginally daily for 2 doses. 01/22/23 01/24/23  Smith, [place] , CNM  ondansetron  (ZOFRAN -ODT) 4 MG disintegrating tablet Take 1-2 tablets (4-8 mg total) by mouth every 6 (six) hours as needed for nausea. Patient not taking: Reported on 02/05/2023 01/21/23   Claudene, [place] , CNM  oxycodone  (OXY-IR) 5 MG capsule Take 1-2 capsules (5-10 mg total) by mouth every 6 (six) hours as needed. Patient  not taking: Reported on 02/05/2023 01/14/23   Edmundo Moats D, CNM  Prenatal Vit-Fe Fumarate-FA (MULTIVITAMIN-PRENATAL) 27-0.8 MG TABS tablet Take 1 tablet by mouth daily at 12 noon. Patient not taking: Reported on 02/05/2023    [provider]  promethazine  (PHENERGAN ) 25 MG tablet Take 0.5-1 tablets (12.5-25 mg total) by mouth every 6 (six) hours as needed for nausea or vomiting. Patient not taking: Reported on 02/05/2023 01/14/23   Edmundo Moats BIRCH, CNM    Family History History reviewed. No pertinent family history.  Social History Social History[1]   Allergies   Shellfish allergy and Sulfa antibiotics   Review of Systems Review of Systems  Constitutional:  Positive for activity change. Negative for chills and fever.  Respiratory:  Negative for shortness of breath.   Cardiovascular:  Negative for chest pain.   Musculoskeletal:  Positive for arthralgias and gait problem.  Skin:  Positive for color change.  Neurological:  Positive for numbness (intermittently). Negative for dizziness and headaches.  All other systems reviewed and are negative.    Physical Exam Triage Vital Signs ED Triage Vitals  Encounter Vitals Group     BP 01/27/25 1334 130/74     Girls Systolic BP Percentile --      Girls Diastolic BP Percentile --      Boys Systolic BP Percentile --      Boys Diastolic BP Percentile --      Pulse Rate 01/27/25 1334 85     Resp 01/27/25 1334 18     Temp 01/27/25 1334 98.8 F (37.1 C)     Temp Source 01/27/25 1334 Oral     SpO2 01/27/25 1334 99 %     Weight --      Height --      Head Circumference --      Peak Flow --      Pain Score 01/27/25 1332 8     Pain Loc --      Pain Education --      Exclude from Growth Chart --    No data found.  Updated Vital Signs BP 130/74 (BP Location: Right Arm)   Pulse 85   Temp 98.8 F (37.1 C) (Oral)   Resp 18   LMP 01/10/2025 (Exact Date)   SpO2 99%   Breastfeeding No   Visual Acuity Right Eye Distance:   Left Eye Distance:   Bilateral Distance:    Right Eye Near:   Left Eye Near:    Bilateral Near:     Physical Exam Vitals and nursing note reviewed.  Constitutional:      General: She is not in acute distress.    Appearance: She is well-developed. She is not toxic-appearing.     Comments: Pleasant female appearing stated age found sitting in chair in no acute distress.  HENT:     Head: Normocephalic and atraumatic.  Eyes:     Conjunctiva/sclera: Conjunctivae normal.  Cardiovascular:     Rate and Rhythm: Normal rate and regular rhythm.     Heart sounds: Normal heart sounds. No murmur heard. Pulmonary:     Effort: Pulmonary effort is normal. No respiratory distress.     Breath sounds: Normal breath sounds.  Musculoskeletal:     Left foot: Decreased range of motion.       Feet:  Feet:     Comments: Ecchymosis and  mild edema left foot Skin:    General: Skin is warm and dry.     Capillary Refill: Capillary  refill takes less than 2 seconds.     Findings: Ecchymosis present.  Neurological:     Mental Status: She is alert and oriented to person, place, and time.  Psychiatric:        Mood and Affect: Mood normal.      UC Treatments / Results  Labs (all labs ordered are listed, but only abnormal results are displayed) Labs Reviewed - No data to display  EKG   Radiology DG Foot Complete Left Result Date: 01/27/2025 CLINICAL DATA:  Left foot pain. EXAM: LEFT FOOT - COMPLETE 3+ VIEW COMPARISON:  None Available. FINDINGS: There is no evidence of fracture or dislocation. There is no evidence of arthropathy or other focal bone abnormality. Soft tissues are unremarkable. IMPRESSION: Negative. Electronically Signed   By: Vanetta Chou M.D.   On: 01/27/2025 14:52    Procedures Procedures (including critical care time)  Medications Ordered in UC Medications - No data to display  Initial Impression / Assessment and Plan / UC Course  I have reviewed the triage vital signs and the nursing notes.  Pertinent labs & imaging results that were available during my care of the patient were reviewed by me and considered in my medical decision making (see chart for details).     Vitals and triage reviewed, patient is hemodynamically stable.  Left foot x-ray obtained, negative for acute fracture or dislocation.  She is placed in Ace wrap and provided crutches.  Prescription given for ibuprofen .  She is given follow-up information for [facility].  Plan of care, follow-up care, return precautions given, no questions at this time. Final Clinical Impressions(s) / UC Diagnoses   Final diagnoses:  Left foot pain  Injury of left foot, initial encounter     Discharge Instructions      Your left foot x-ray is negative for fracture or dislocation.  We wrapped your foot in an Ace wrap.  You have been  provided with crutches.  You have been given a prescription for ibuprofen .  Take 1 tablet every 8 hours as needed. Take Tylenol as needed.  Rest, ice, elevate, and compress the injury to reduce swelling and inflammation.  If your symptoms do not improve over the next 3-5 days, schedule an appointment with the orthopedic provider listed below: [facility] [address] [place], [place]  [address] [phone]  Return if you experience worsening pain, numbness, tingling, skin color changes, or any other concerning symptoms. If symptoms are severe, please go to the ER. I hope you feel better!!       ED Prescriptions     Medication Sig Dispense Auth. Provider   ibuprofen  (ADVIL ) 800 MG tablet Take 1 tablet (800 mg total) by mouth every 8 (eight) hours as needed. 21 tablet Jericca Russett C, FNP      PDMP not reviewed this encounter.     [1]  Social History Tobacco Use   Smoking status: Never   Smokeless tobacco: Former   Tobacco comments:    Delta 8  Vaping Use   Vaping status: Former   Substances: THC, CBD  Substance Use Topics   Alcohol use: Yes   Drug use: Not Currently     Lennice Jon BROCKS, FNP 01/27/25 1547  "

## 2025-02-04 ENCOUNTER — Encounter: Payer: Self-pay | Admitting: Podiatry

## 2025-02-04 ENCOUNTER — Ambulatory Visit

## 2025-02-04 ENCOUNTER — Ambulatory Visit: Admitting: Podiatry

## 2025-02-04 DIAGNOSIS — M7752 Other enthesopathy of left foot: Secondary | ICD-10-CM

## 2025-02-04 DIAGNOSIS — S93505A Unspecified sprain of left lesser toe(s), initial encounter: Secondary | ICD-10-CM

## 2025-02-04 DIAGNOSIS — S92502A Displaced unspecified fracture of left lesser toe(s), initial encounter for closed fracture: Secondary | ICD-10-CM

## 2025-02-04 NOTE — Progress Notes (Signed)
 "  Subjective:  Patient ID: Wanda Brewer, female    DOB: 01/26/1996,  MRN: [id number]  Chief Complaint  Patient presents with   Foot Pain    Left foot pain.  Patient fell on ice Left 2nd-4th toes bent backwards. Bruising and swelling.     Discussed the use of AI scribe software for clinical note transcription with the patient, who gave verbal consent to proceed.  History of Present Illness Wanda Brewer is a 29 year old female who presents with persistent left forefoot pain following traumatic injury.  Two weeks ago she fell, causing forced hyperflexion of the left hallux and hyperextension of the lesser toes with full body weight on the smaller toes. Since then she has had significant left forefoot and toe pain and difficulty bearing weight.  Initial nonweightbearing foot x-rays at urgent care after a minute clinic visit showed no obvious fracture. She was given ibuprofen  and crutches.  She works as a mudlogger and stands for prolonged periods. Since returning to full duty on February 1, she has had recurrent ecchymosis of all left toes except the hallux after shifts, with a large purple bruise on the second toe after yesterday's shift. All toes are painful with weight bearing, with maximal tenderness at the fifth toe, especially to palpation. She can plantarflex and dorsiflex the toes, though resisted plantarflexion is painful. Abduction and adduction against resistance are tolerated.  She uses ibuprofen  as needed and is not taking other analgesics. Her manager can provide light duty, and she is requesting documentation to limit standing at work.      Objective:    Physical Exam EXTREMITIES: Left forefoot edematous. DP and PT pulses palpable bilaterally. Capillary refill intact. MUSCULOSKELETAL: Pain with left foot plantar flexion and increased pain with dorsiflexion against resistance. Slight splaying of second and third toes.  Tenderness on palpation with passive range of  motion second, third, fourth, fifth toes left foot. NEUROLOGICAL: Light touch sensation intact. Dermatologic: Ecchymosis noted about the second, third, fourth, fifth toes left foot.  No open wounds noted   No images are attached to the encounter.    Results Radiology Left foot x-ray (02/04/2025): Nondisplaced fracture of the third toe with cortical irregularity and thin lucent line; slight splaying of second and third toes; no radiographically apparent fracture of the fifth toe; possible subtle abnormality within the fifth base of the intermediate phalanx with slight step off noted; no significant displacement. (Independently interpreted)   Assessment:   1. Fracture of third toe, left, closed, initial encounter   2. Sprain of toe, fifth, left, initial encounter   3. Sprain of toe, fourth, left, initial encounter   4. Sprain of toe, second, left, initial encounter      Plan:  Patient was evaluated and treated and all questions answered.  Assessment and Plan Assessment & Plan Fracture of left third toe, possible fifth toe fracture Acute, nondisplaced fracture confirmed by x-ray. Expected to heal in 6-8 weeks without surgery. Prioritized protection and pain management. - Dispensed and Applied surgical shoe for protection and ambulation. - Provided work note for accommodations. - Recommended foot elevation above chest level during rest. - Advised compression ankle sleeve or ACE wrap for swelling and pain. - Recommended ice application for two weeks. - Advised acetaminophen and ibuprofen  every six hours as needed. - Scheduled follow-up in four weeks.  Sprain of left second and fifth toes Subacute soft tissue injury involving joint capsules and ligaments. Stabilization and conservative management indicated. - Applied buddy splinting  to second and third toes with Coban. - Advised gentle taping of affected toes. - Recommended continued use of surgical shoe. - Advised ice application,  elevation, and compression as above. - Recommended acetaminophen and ibuprofen  as needed.      Return in about 4 weeks (around 03/04/2025) for toe fracture check.      "

## 2025-03-04 ENCOUNTER — Ambulatory Visit: Admitting: Podiatry
